# Patient Record
Sex: Female | Born: 1965 | Hispanic: No | Marital: Married | Smoking: Current every day smoker
Health system: Southern US, Community
[De-identification: ages and names within clinical notes are randomized; demographics above are authoritative.]

## PROBLEM LIST (undated history)

## (undated) DIAGNOSIS — I2699 Other pulmonary embolism without acute cor pulmonale: Secondary | ICD-10-CM

## (undated) DIAGNOSIS — J45909 Unspecified asthma, uncomplicated: Secondary | ICD-10-CM

## (undated) HISTORY — PX: TOTAL KNEE ARTHROPLASTY: SHX125

---

## 2012-05-27 ENCOUNTER — Emergency Department: Payer: Self-pay | Admitting: Emergency Medicine

## 2012-05-27 LAB — PREGNANCY, URINE: Pregnancy Test, Urine: NEGATIVE m[IU]/mL

## 2012-10-24 ENCOUNTER — Emergency Department: Payer: Self-pay | Admitting: Emergency Medicine

## 2012-10-24 LAB — COMPREHENSIVE METABOLIC PANEL
Albumin: 3.6 g/dL (ref 3.4–5.0)
Alkaline Phosphatase: 89 U/L (ref 50–136)
Bilirubin,Total: 0.3 mg/dL (ref 0.2–1.0)
Creatinine: 0.38 mg/dL — ABNORMAL LOW (ref 0.60–1.30)
SGOT(AST): 14 U/L — ABNORMAL LOW (ref 15–37)
SGPT (ALT): 24 U/L (ref 12–78)
Sodium: 140 mmol/L (ref 136–145)
Total Protein: 8.1 g/dL (ref 6.4–8.2)

## 2012-10-24 LAB — URINALYSIS, COMPLETE
Bacteria: NONE SEEN
Glucose,UR: NEGATIVE mg/dL (ref 0–75)
Ketone: NEGATIVE
Specific Gravity: 1.014 (ref 1.003–1.030)

## 2012-10-24 LAB — CBC
HCT: 34.1 % — ABNORMAL LOW (ref 35.0–47.0)
HGB: 11.6 g/dL — ABNORMAL LOW (ref 12.0–16.0)
MCH: 32 pg (ref 26.0–34.0)
MCHC: 33.9 g/dL (ref 32.0–36.0)
RDW: 15.1 % — ABNORMAL HIGH (ref 11.5–14.5)

## 2012-10-31 ENCOUNTER — Ambulatory Visit: Payer: Self-pay | Admitting: Ophthalmology

## 2013-05-27 ENCOUNTER — Emergency Department: Payer: Self-pay | Admitting: Emergency Medicine

## 2013-07-05 ENCOUNTER — Emergency Department: Payer: Self-pay | Admitting: Emergency Medicine

## 2013-07-05 ENCOUNTER — Inpatient Hospital Stay (HOSPITAL_COMMUNITY): Payer: Medicaid Other

## 2013-07-05 ENCOUNTER — Observation Stay (HOSPITAL_COMMUNITY)
Admission: AD | Admit: 2013-07-05 | Discharge: 2013-07-05 | Disposition: A | Discharge status: Home / Self Care | Payer: Medicaid Other | Source: Other Acute Inpatient Hospital | Attending: Neurosurgery | Admitting: Neurosurgery

## 2013-07-05 ENCOUNTER — Encounter (HOSPITAL_COMMUNITY): Payer: Self-pay | Admitting: *Deleted

## 2013-07-05 DIAGNOSIS — S065X0A Traumatic subdural hemorrhage without loss of consciousness, initial encounter: Principal | ICD-10-CM | POA: Insufficient documentation

## 2013-07-05 DIAGNOSIS — W1789XA Other fall from one level to another, initial encounter: Secondary | ICD-10-CM | POA: Insufficient documentation

## 2013-07-05 LAB — CBC WITH DIFFERENTIAL/PLATELET
HCT: 30.1 % — ABNORMAL LOW (ref 35.0–47.0)
Lymphocyte %: 36.4 %
MCH: 31.4 pg (ref 26.0–34.0)
Neutrophil #: 3.5 10*3/uL (ref 1.4–6.5)
Neutrophil %: 53 %
RDW: 15.6 % — ABNORMAL HIGH (ref 11.5–14.5)
WBC: 6.5 10*3/uL (ref 3.6–11.0)

## 2013-07-05 LAB — COMPREHENSIVE METABOLIC PANEL
Albumin: 3.2 g/dL — ABNORMAL LOW (ref 3.4–5.0)
Alkaline Phosphatase: 65 U/L (ref 50–136)
Anion Gap: 2 — ABNORMAL LOW (ref 7–16)
Calcium, Total: 8.7 mg/dL (ref 8.5–10.1)
Co2: 30 mmol/L (ref 21–32)
EGFR (Non-African Amer.): >60
Osmolality: 276 (ref 275–301)
SGOT(AST): 13 U/L — ABNORMAL LOW (ref 15–37)
SGPT (ALT): 15 U/L (ref 12–78)
Sodium: 140 mmol/L (ref 136–145)
Total Protein: 7.8 g/dL (ref 6.4–8.2)

## 2013-07-05 LAB — PROTIME-INR: Prothrombin Time: 11.9 secs (ref 11.5–14.7)

## 2013-07-05 MED ORDER — OXYCODONE-ACETAMINOPHEN 5-325 MG PO TABS
1.0000 | ORAL_TABLET | ORAL | Status: DC | PRN
  Administered 2013-07-05 (×2): 1 via ORAL
  Filled 2013-07-05 (×2): qty 1

## 2013-07-05 NOTE — Discharge Summary (Signed)
Physician Discharge Summary  Patient ID: Mariah Robbins MRN: [id number] DOB/AGE: Oct 25, 1966 47 y.o.  Admit date: 07/05/2013 Discharge date: 07/05/2013  Admission Diagnoses: chi, small tentorium sdh  Discharge Diagnoses: same Active Problems:   * No active hospital problems. *   Discharged Condition: decrease headache  Hospital Course: observation  Consults: none  Significant Diagnostic Studies: ct head  Treatments: observation  Discharge Exam: Blood pressure 103/61, pulse 69, temperature 98.3 F (36.8 C), temperature source Oral, resp. rate 18, height 5\' 2"  (1.575 m), weight 88.4 kg (194 lb 14.2 oz), SpO2 98.00%. Normal examination. Ct done shows no changes. Advised not to drink for at least 2 weeks and not to ride motorcycles. To f/u with her MD. To call me prn  Disposition: Final discharge disposition not confirmed     Medication List    Notice   You have not been prescribed any medications.       Signed: Karn Cassis 07/05/2013, 11:56 AM

## 2013-07-05 NOTE — Progress Notes (Signed)
Patient ID: Mariah Robbins, female   DOB: [birth year], 47 y.o.   MRN: [id number] BP 102/68  Pulse 66  Temp(Src) 98.5 F (36.9 C) (Oral)  Resp 18  Ht 5\' 2"  (1.575 m)  Wt 88.4 kg (194 lb 14.2 oz)  BMI 35.64 kg/m2  SpO2 100% Alert and oreinted x 4, speech is clear and fluent Perrl, full eom Symmetric facial movements Hearing intact to voice Moving all extremities well Small blush over the left tentorium, ventricles not effaced, no mass effect. Non operative lesion.  Complaining of headaches currently.

## 2013-07-05 NOTE — Progress Notes (Signed)
Patient education complete and discharge signed.  Pain medication prescription given to patient by Dr. Jeral Fruit.  IV removed and patient left with fiance refusing wheelchair.  Lance Bosch, RN

## 2013-07-05 NOTE — Progress Notes (Signed)
Patient arrived to floor.  Dr. Jeral Fruit called for orders. Orders received for pain medication and diet.
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
## 2013-10-13 ENCOUNTER — Emergency Department: Payer: Self-pay | Admitting: Emergency Medicine

## 2013-10-13 LAB — URINALYSIS, COMPLETE
Bacteria: NONE SEEN
Ketone: NEGATIVE
Nitrite: NEGATIVE
Ph: 5 (ref 4.5–8.0)
Protein: NEGATIVE
Specific Gravity: 1.013 (ref 1.003–1.030)
Squamous Epithelial: 1
WBC UR: <1 /HPF (ref 0–5)

## 2014-01-03 ENCOUNTER — Emergency Department: Payer: Self-pay | Admitting: Emergency Medicine

## 2015-10-22 ENCOUNTER — Encounter: Payer: Self-pay | Admitting: Emergency Medicine

## 2015-10-22 ENCOUNTER — Emergency Department
Admission: EM | Admit: 2015-10-22 | Discharge: 2015-10-22 | Disposition: A | Discharge status: Home / Self Care | Payer: Medicaid Other | Attending: Emergency Medicine | Admitting: Emergency Medicine

## 2015-10-22 ENCOUNTER — Emergency Department: Payer: Medicaid Other

## 2015-10-22 DIAGNOSIS — W108XXA Fall (on) (from) other stairs and steps, initial encounter: Secondary | ICD-10-CM | POA: Diagnosis not present

## 2015-10-22 DIAGNOSIS — Y998 Other external cause status: Secondary | ICD-10-CM | POA: Insufficient documentation

## 2015-10-22 DIAGNOSIS — F17210 Nicotine dependence, cigarettes, uncomplicated: Secondary | ICD-10-CM | POA: Insufficient documentation

## 2015-10-22 DIAGNOSIS — Z96651 Presence of right artificial knee joint: Secondary | ICD-10-CM | POA: Insufficient documentation

## 2015-10-22 DIAGNOSIS — Y9301 Activity, walking, marching and hiking: Secondary | ICD-10-CM | POA: Diagnosis not present

## 2015-10-22 DIAGNOSIS — S99911A Unspecified injury of right ankle, initial encounter: Secondary | ICD-10-CM | POA: Diagnosis present

## 2015-10-22 DIAGNOSIS — S8391XA Sprain of unspecified site of right knee, initial encounter: Secondary | ICD-10-CM | POA: Insufficient documentation

## 2015-10-22 DIAGNOSIS — Y9289 Other specified places as the place of occurrence of the external cause: Secondary | ICD-10-CM | POA: Insufficient documentation

## 2015-10-22 DIAGNOSIS — S93401A Sprain of unspecified ligament of right ankle, initial encounter: Secondary | ICD-10-CM | POA: Diagnosis not present

## 2015-10-22 MED ORDER — MELOXICAM 15 MG PO TABS: 15.0000 mg | ORAL_TABLET | Freq: Every day | ORAL | Status: DC

## 2015-10-22 MED ORDER — HYDROCODONE-ACETAMINOPHEN 5-325 MG PO TABS
1.0000 | ORAL_TABLET | Freq: Once | ORAL | Status: AC
  Administered 2015-10-22: 1 via ORAL
  Filled 2015-10-22: qty 1

## 2015-10-22 NOTE — ED Notes (Signed)
Fell twisted right ankle   And also having some pain to knee

## 2015-10-22 NOTE — ED Provider Notes (Signed)
[facility] Emergency Department Provider Note  ____________________________________________  Time seen: Approximately 11:30 AM  I have reviewed the triage vital signs and the nursing notes.   HISTORY  Chief Complaint Ankle Pain    HPI Mariah SchirmerJessica Robbins is a 49 y.o. female who presents to emergency department complaining of right ankle and right knee pain. She states that she "missed a step" yesterday while walking down some stairs. She "twisted my ankle, and hit my knee. She is now endorsing sharp right ankle pain as well as sharp pain to the right medial knee. Patient does have a history of knee replacement. Patient states that ankle pain is located over the lateral and medial malleolus as well as the posterior calcaneus. Patient is also endorsing sharp medial knee pain. She denies any other injury. She denies hitting her head and losing consciousness. She states the pain is constant, severe, and worse with ambulation.   History reviewed. No pertinent past medical history.  There are no active problems to display for this patient.   Past Surgical History  Procedure Laterality Date  . Total knee arthroplasty      Current Outpatient Rx  Name  Route  Sig  Dispense  Refill  . meloxicam (MOBIC) 15 MG tablet   Oral   Take 1 tablet (15 mg total) by mouth daily.   30 tablet   0     Allergies Review of patient's allergies indicates no known allergies.  No family history on file.  Social History Social History  Substance Use Topics  . Smoking status: Current Every Day Smoker -- 1.00 packs/day for 20 years    Types: Cigarettes  . Smokeless tobacco: None  . Alcohol Use: 1.0 oz/week    2 Standard drinks or equivalent per week    Review of Systems Constitutional: No fever/chills Eyes: No visual changes. ENT: No sore throat. Cardiovascular: Denies chest pain. Respiratory: Denies shortness of breath. Gastrointestinal: No abdominal pain.  No nausea,  no vomiting.  No diarrhea.  No constipation. Genitourinary: Negative for dysuria. Musculoskeletal: Negative for back pain. Endorses right ankle and right knee pain. Skin: Negative for rash. Neurological: Negative for headaches, focal weakness or numbness.  10-point ROS otherwise negative.  ____________________________________________   PHYSICAL EXAM:  VITAL SIGNS: ED Triage Vitals  Enc Vitals Group     BP 10/22/15 0945 124/68 mmHg     Pulse Rate 10/22/15 0942 89     Resp 10/22/15 0942 20     Temp 10/22/15 0942 98.4 F (36.9 C)     Temp Source 10/22/15 0942 Oral     SpO2 10/22/15 0942 98 %     Weight 10/22/15 0942 198 lb (89.812 kg)     Height 10/22/15 0942 5\' 2"  (1.575 m)     Head Cir --      Peak Flow --      Pain Score --      Pain Loc --      Pain Edu? --      Excl. in GC? --     Constitutional: Alert and oriented. Well appearing and in no acute distress. Eyes: Conjunctivae are normal. PERRL. EOMI. Head: Atraumatic. Nose: No congestion/rhinnorhea. Mouth/Throat: Mucous membranes are moist.  Oropharynx non-erythematous. Neck: No stridor.   Cardiovascular: Normal rate, regular rhythm. Grossly normal heart sounds.  Good peripheral circulation. Respiratory: Normal respiratory effort.  No retractions. Lungs CTAB. Gastrointestinal: Soft and nontender. No distention. No abdominal bruits. No CVA tenderness. Musculoskeletal: No visible deformity or  edema noted to right knee when compared with left. Surgical scar is appreciated to the anterior aspect knee. Patient is tender to palpation over the medial joint line. No other tenderness to palpation of the knee. Patient has edema to right lateral malleolus upon inspection. No other visible injury. Patient is diffusely tender to palpation over the medial and lateral malleolus as well as the posterior calcaneus. No tenderness to palpation over the tarsal or metatarsal bones. No tenderness to palpation over the base of the fifth  metatarsal. Patient has limited range of motion of ankle due to pain. Neurologic:  Normal speech and language. No gross focal neurologic deficits are appreciated. No gait instability. Skin:  Skin is warm, dry and intact. No rash noted. Psychiatric: Mood and affect are normal. Speech and behavior are normal.  ____________________________________________   LABS (all labs ordered are listed, but only abnormal results are displayed)  Labs Reviewed - No data to display ____________________________________________  EKG   ____________________________________________  RADIOLOGY  Right knee x-ray Impression: Prosthetic right knee joint and surrounding native bone or unremarkable.  Right ankle x-ray Impression: No acute fracture dislocation of the right ankle. There is a large amount of soft tissue swelling anteriorly and laterally. ____________________________________________   PROCEDURES  Procedure(s) performed: None  Critical Care performed: No  ____________________________________________   INITIAL IMPRESSION / ASSESSMENT AND PLAN / ED COURSE  Pertinent labs & imaging results that were available during my care of the patient were reviewed by me and considered in my medical decision making (see chart for details).  Agents history, symptoms, physical exam are taken into consideration her diagnosis. Diagnosis is consistent with right ankle sprain and right knee sprain. Advised patient of findings and diagnosis and she verbalizes understanding same. Patient will be given anti-inflammatories for symptomatic control. Patient's ankle is wrapped in an Ace bandage. Patient given crutches to use. Patient discharged with instructions to follow up with orthopedics should symptoms persist. Patient verbalizes understanding of diagnosis and treatment plan and verbalizes compliance with same. ____________________________________________   FINAL CLINICAL IMPRESSION(S) / ED DIAGNOSES  Final  diagnoses:  Right ankle sprain, initial encounter  Right knee sprain, initial encounter      Racheal Patches, PA-C 10/22/15 1212  Emily Filbert, MD 10/22/15 1323

## 2015-10-22 NOTE — Discharge Instructions (Signed)
Ankle Sprain °An ankle sprain is an injury to the strong, fibrous tissues (ligaments) that hold the bones of your ankle joint together.  °CAUSES °An ankle sprain is usually caused by a fall or by twisting your ankle. Ankle sprains most commonly occur when you step on the outer edge of your foot, and your ankle turns inward. People who participate in sports are more prone to these types of injuries.  °SYMPTOMS  °· Pain in your ankle. The pain may be present at rest or only when you are trying to stand or walk. °· Swelling. °· Bruising. Bruising may develop immediately or within 1 to 2 days after your injury. °· Difficulty standing or walking, particularly when turning corners or changing directions. °DIAGNOSIS  °Your caregiver will ask you details about your injury and perform a physical exam of your ankle to determine if you have an ankle sprain. During the physical exam, your caregiver will press on and apply pressure to specific areas of your foot and ankle. Your caregiver will try to move your ankle in certain ways. An X-ray exam may be done to be sure a bone was not broken or a ligament did not separate from one of the bones in your ankle (avulsion fracture).  °TREATMENT  °Certain types of braces can help stabilize your ankle. Your caregiver can make a recommendation for this. Your caregiver may recommend the use of medicine for pain. If your sprain is severe, your caregiver may refer you to a surgeon who helps to restore function to parts of your skeletal system (orthopedist) or a physical therapist. °HOME CARE INSTRUCTIONS  °· Apply ice to your injury for 1-2 days or as directed by your caregiver. Applying ice helps to reduce inflammation and pain. °· Put ice in a plastic bag. °· Place a towel between your skin and the bag. °· Leave the ice on for 15-20 minutes at a time, every 2 hours while you are awake. °· Only take over-the-counter or prescription medicines for pain, discomfort, or fever as directed by  your caregiver. °· Elevate your injured ankle above the level of your heart as much as possible for 2-3 days. °· If your caregiver recommends crutches, use them as instructed. Gradually put weight on the affected ankle. Continue to use crutches or a cane until you can walk without feeling pain in your ankle. °· If you have a plaster splint, wear the splint as directed by your caregiver. Do not rest it on anything harder than a pillow for the first 24 hours. Do not put weight on it. Do not get it wet. You may take it off to take a shower or bath. °· You may have been given an elastic bandage to wear around your ankle to provide support. If the elastic bandage is too tight (you have numbness or tingling in your foot or your foot becomes cold and blue), adjust the bandage to make it comfortable. °· If you have an air splint, you may blow more air into it or let air out to make it more comfortable. You may take your splint off at night and before taking a shower or bath. Wiggle your toes in the splint several times per day to decrease swelling. °SEEK MEDICAL CARE IF:  °· You have rapidly increasing bruising or swelling. °· Your toes feel extremely cold or you lose feeling in your foot. °· Your pain is not relieved with medicine. °SEEK IMMEDIATE MEDICAL CARE IF: °· Your toes are numb or blue. °·   You have severe pain that is increasing. °MAKE SURE YOU:  °· Understand these instructions. °· Will watch your condition. °· Will get help right away if you are not doing well or get worse. °  °This information is not intended to replace advice given to you by your health care provider. Make sure you discuss any questions you have with your health care provider. °  °Document Released: 11/06/2005 Document Revised: 11/27/2014 Document Reviewed: 11/18/2011 °Elsevier Interactive Patient Education ©2016 [employer] ° °Cryotherapy °Cryotherapy means treatment with cold. Ice or gel packs can be used to reduce both pain and swelling.  Ice is the most helpful within the first 24 to 48 hours after an injury or flare-up from overusing a muscle or joint. Sprains, strains, spasms, burning pain, shooting pain, and aches can all be eased with ice. Ice can also be used when recovering from surgery. Ice is effective, has very few side effects, and is safe for most people to use. °PRECAUTIONS  °Ice is not a safe treatment option for people with: °· Raynaud phenomenon. This is a condition affecting small blood vessels in the extremities. Exposure to cold may cause your problems to return. °· Cold hypersensitivity. There are many forms of cold hypersensitivity, including: °¨ Cold urticaria. Red, itchy hives appear on the skin when the tissues begin to warm after being iced. °¨ Cold erythema. This is a red, itchy rash caused by exposure to cold. °¨ Cold hemoglobinuria. Red blood cells break down when the tissues begin to warm after being iced. The hemoglobin that carry oxygen are passed into the urine because they cannot combine with blood proteins fast enough. °· Numbness or altered sensitivity in the area being iced. °If you have any of the following conditions, do not use ice until you have discussed cryotherapy with your caregiver: °· Heart conditions, such as arrhythmia, angina, or chronic heart disease. °· High blood pressure. °· Healing wounds or open skin in the area being iced. °· Current infections. °· Rheumatoid arthritis. °· Poor circulation. °· Diabetes. °Ice slows the blood flow in the region it is applied. This is beneficial when trying to stop inflamed tissues from spreading irritating chemicals to surrounding tissues. However, if you expose your skin to cold temperatures for too long or without the proper protection, you can damage your skin or nerves. Watch for signs of skin damage due to cold. °HOME CARE INSTRUCTIONS °Follow these tips to use ice and cold packs safely. °· Place a dry or damp towel between the ice and skin. A damp towel will  cool the skin more quickly, so you may need to shorten the time that the ice is used. °· For a more rapid response, add gentle compression to the ice. °· Ice for no more than 10 to 20 minutes at a time. The bonier the area you are icing, the less time it will take to get the benefits of ice. °· Check your skin after 5 minutes to make sure there are no signs of a poor response to cold or skin damage. °· Rest 20 minutes or more between uses. °· Once your skin is numb, you can end your treatment. You can test numbness by very lightly touching your skin. The touch should be so light that you do not see the skin dimple from the pressure of your fingertip. When using ice, most people will feel these normal sensations in this order: cold, burning, aching, and numbness. °· Do not use ice on someone who   cannot communicate their responses to pain, such as small children or people with dementia. °HOW TO MAKE AN ICE PACK °Ice packs are the most common way to use ice therapy. Other methods include ice massage, ice baths, and cryosprays. Muscle creams that cause a cold, tingly feeling do not offer the same benefits that ice offers and should not be used as a substitute unless recommended by your caregiver. °To make an ice pack, do one of the following: °· Place crushed ice or a bag of frozen vegetables in a sealable plastic bag. Squeeze out the excess air. Place this bag inside another plastic bag. Slide the bag into a pillowcase or place a damp towel between your skin and the bag. °· Mix 3 parts water with 1 part rubbing alcohol. Freeze the mixture in a sealable plastic bag. When you remove the mixture from the freezer, it will be slushy. Squeeze out the excess air. Place this bag inside another plastic bag. Slide the bag into a pillowcase or place a damp towel between your skin and the bag. °SEEK MEDICAL CARE IF: °· You develop white spots on your skin. This may give the skin a blotchy (mottled) appearance. °· Your skin turns  blue or pale. °· Your skin becomes waxy or hard. °· Your swelling gets worse. °MAKE SURE YOU:  °· Understand these instructions. °· Will watch your condition. °· Will get help right away if you are not doing well or get worse. °  °This information is not intended to replace advice given to you by your health care provider. Make sure you discuss any questions you have with your health care provider. °  °Document Released: 07/03/2011 Document Revised: 11/27/2014 Document Reviewed: 07/03/2011 °Elsevier Interactive Patient Education ©2016 [employer] ° °Elastic Bandage and RICE °WHAT DOES AN ELASTIC BANDAGE DO? °Elastic bandages come in different shapes and sizes. They generally provide support to your injury and reduce swelling while you are healing, but they can perform different functions. Your health care provider will help you to decide what is best for your protection, recovery, or rehabilitation following an injury. °WHAT ARE SOME GENERAL TIPS FOR USING AN ELASTIC BANDAGE? °· Use the bandage as directed by the maker of the bandage that you are using. °· Do not wrap the bandage too tightly. This may cut off the circulation in the arm or leg in the area below the bandage. °¨ If part of your body beyond the bandage becomes blue, numb, cold, swollen, or is more painful, your bandage is most likely too tight. If this occurs, remove your bandage and reapply it more loosely. °· See your health care provider if the bandage seems to be making your problems worse rather than better. °· An elastic bandage should be removed and reapplied every 3-4 hours or as directed by your health care provider. °WHAT IS RICE? °The routine care of many injuries includes rest, ice, compression, and elevation (RICE therapy).  °Rest °Rest is required to allow your body to heal. Generally, you can resume your routine activities when you are comfortable and have been given permission by your health care provider. °Ice °Icing your injury helps  to keep the swelling down and it reduces pain. Do not apply ice directly to your skin. °· Put ice in a plastic bag. °· Place a towel between your skin and the bag. °· Leave the ice on for 20 minutes, 2-3 times per day. °Do this for as long as you are directed by your health   care provider. °Compression °Compression helps to keep swelling down, gives support, and helps with discomfort. Compression may be done with an elastic bandage. °Elevation °Elevation helps to reduce swelling and it decreases pain. If possible, your injured area should be placed at or above the level of your heart or the center of your chest. °WHEN SHOULD I SEEK MEDICAL CARE? °You should seek medical care if: °· You have persistent pain and swelling. °· Your symptoms are getting worse rather than improving. °These symptoms may indicate that further evaluation or further X-rays are needed. Sometimes, X-rays may not show a small broken bone (fracture) until a number of days later. Make a follow-up appointment with your health care provider. Ask when your X-ray results will be ready. Make sure that you get your X-ray results. °WHEN SHOULD I SEEK IMMEDIATE MEDICAL CARE? °You should seek immediate medical care if: °· You have a sudden onset of severe pain at or below the area of your injury. °· You develop redness or increased swelling around your injury. °· You have tingling or numbness at or below the area of your injury that does not improve after you remove the elastic bandage. °  °This information is not intended to replace advice given to you by your health care provider. Make sure you discuss any questions you have with your health care provider. °  °Document Released: 04/28/2002 Document Revised: 07/28/2015 Document Reviewed: 06/22/2014 °Elsevier Interactive Patient Education ©2016 [employer] ° °

## 2016-08-01 ENCOUNTER — Emergency Department
Admission: EM | Admit: 2016-08-01 | Discharge: 2016-08-01 | Disposition: A | Discharge status: Home / Self Care | Payer: Medicaid Other | Attending: Emergency Medicine | Admitting: Emergency Medicine

## 2016-08-01 ENCOUNTER — Encounter: Payer: Self-pay | Admitting: Emergency Medicine

## 2016-08-01 DIAGNOSIS — F17210 Nicotine dependence, cigarettes, uncomplicated: Secondary | ICD-10-CM | POA: Diagnosis not present

## 2016-08-01 DIAGNOSIS — M25571 Pain in right ankle and joints of right foot: Secondary | ICD-10-CM | POA: Diagnosis not present

## 2016-08-01 DIAGNOSIS — L209 Atopic dermatitis, unspecified: Secondary | ICD-10-CM | POA: Diagnosis not present

## 2016-08-01 DIAGNOSIS — L0231 Cutaneous abscess of buttock: Secondary | ICD-10-CM | POA: Diagnosis present

## 2016-08-01 MED ORDER — SULFAMETHOXAZOLE-TRIMETHOPRIM 800-160 MG PO TABS: 1.0000 | ORAL_TABLET | Freq: Two times a day (BID) | ORAL | 0 refills | Status: DC

## 2016-08-01 MED ORDER — HYDROCODONE-ACETAMINOPHEN 5-325 MG PO TABS: 1.0000 | ORAL_TABLET | ORAL | 0 refills | Status: DC | PRN

## 2016-08-01 MED ORDER — TRIAMCINOLONE ACETONIDE 0.5 % EX OINT: 1.0000 "application " | TOPICAL_OINTMENT | Freq: Two times a day (BID) | CUTANEOUS | 0 refills | Status: DC

## 2016-08-01 MED ORDER — HYDROCODONE-ACETAMINOPHEN 5-325 MG PO TABS
1.0000 | ORAL_TABLET | Freq: Once | ORAL | Status: AC
Start: 2016-08-01 — End: 2016-08-01
  Administered 2016-08-01: 1 via ORAL
  Filled 2016-08-01: qty 1

## 2016-08-01 MED ORDER — LIDOCAINE-EPINEPHRINE (PF) 1 %-1:200000 IJ SOLN
30.0000 mL | Freq: Once | INTRAMUSCULAR | Status: AC
  Administered 2016-08-01: 30 mL
  Filled 2016-08-01: qty 30

## 2016-08-01 MED ORDER — MELOXICAM 15 MG PO TABS: 15.0000 mg | ORAL_TABLET | Freq: Every day | ORAL | 0 refills | Status: DC

## 2016-08-01 NOTE — ED Triage Notes (Signed)
Abscess to sacral area for three days with foul odor and drainage.

## 2016-08-01 NOTE — ED Provider Notes (Signed)
[facility] Emergency Department Provider Note  ____________________________________________  Time seen: Approximately 1:15 PM  I have reviewed the triage vital signs and the nursing notes.   HISTORY  Chief Complaint Abscess   HPI Mariah Robbins is a 50 y.o. female who presents to the emergency department for evaluation of an abscess to the sacral area. Area has been present for the past 3 days. She states that she has been having her husband "squeeze it" and has been getting out from drainage with a foul odor. She denies fever or nausea/vomiting. She also complains today of skin irritation and peeling to her left hand. This is been present for several weeks and she has attempted to use hydrocortisone cream as well as some leftover prescription creams without any relief. She also complains of left ankle pain. She states that this is been present for quite some time. She states that the pain seems to be worse in the morning and has been getting worse for the past 2 days.  History reviewed. No pertinent past medical history.  There are no active problems to display for this patient.   Past Surgical History:  Procedure Laterality Date  . TOTAL KNEE ARTHROPLASTY      Prior to Admission medications   Medication Sig Start Date End Date Taking? Authorizing Provider  HYDROcodone-acetaminophen (NORCO/VICODIN) 5-325 MG tablet Take 1 tablet by mouth every 4 (four) hours as needed. 08/01/16   Chinita Pester, FNP  meloxicam (MOBIC) 15 MG tablet Take 1 tablet (15 mg total) by mouth daily. 08/01/16   Chinita Pester, FNP  sulfamethoxazole-trimethoprim (BACTRIM DS,SEPTRA DS) 800-160 MG tablet Take 1 tablet by mouth 2 (two) times daily. 08/01/16   Chinita Pester, FNP  triamcinolone ointment (KENALOG) 0.5 % Apply 1 application topically 2 (two) times daily. 08/01/16   Chinita Pester, FNP    Allergies Review of patient's allergies indicates no known allergies.  No family  history on file.  Social History Social History  Substance Use Topics  . Smoking status: Current Every Day Smoker    Packs/day: 1.00    Years: 20.00    Types: Cigarettes  . Smokeless tobacco: Never Used  . Alcohol use 1.0 oz/week    2 Standard drinks or equivalent per week    Review of Systems  Constitutional: Negative for fever/chills Respiratory: Negative for shortness of breath. Musculoskeletal: Positive for pain. Skin: Positive for lesion on the right buttock Neurological: Negative for headaches, focal weakness or numbness. ____________________________________________   PHYSICAL EXAM:  VITAL SIGNS: ED Triage Vitals  Enc Vitals Group     BP 08/01/16 1136 (!) 125/97     Pulse Rate 08/01/16 1136 (!) 103     Resp 08/01/16 1136 20     Temp 08/01/16 1136 98.9 F (37.2 C)     Temp Source 08/01/16 1136 Oral     SpO2 08/01/16 1136 98 %     Weight 08/01/16 1136 210 lb (95.3 kg)     Height 08/01/16 1136 5\' 2"  (1.575 m)     Head Circumference --      Peak Flow --      Pain Score 08/01/16 1137 10     Pain Loc --      Pain Edu? --      Excl. in GC? --      Constitutional: Alert and oriented. Well appearing and in no acute distress. Eyes: Conjunctivae are normal. EOMI. Nose: No congestion/rhinnorhea. Mouth/Throat: Mucous membranes are moist.  Neck: No stridor. Cardiovascular: Good peripheral circulation. Respiratory: Normal respiratory effort.  No retractions. Musculoskeletal: FROM throughout. Pain in the left ankle is not reproducible with movement or palpation. Neurologic:  Normal speech and language. No gross focal neurologic deficits are appreciated. Skin:  Indurated and fluctuant lesion in the right buttock with scant amount of purulent drainage noted.  ____________________________________________   LABS (all labs ordered are listed, but only abnormal results are displayed)  Labs Reviewed - No data to  display ____________________________________________  EKG   ____________________________________________  RADIOLOGY  Not indicated ____________________________________________   PROCEDURES  Procedure(s) performed: INCISION AND DRAINAGE Performed by: Kem Boroughsari Harvest Stanco Consent: Verbal consent obtained. Risks and benefits: risks, benefits and alternatives were discussed Type: abscess  Body area: right buttock  Anesthesia: local infiltration  Incision was made with a scalpel.  Local anesthetic: lidocaine 1% with epinephrine  Anesthetic total: 4 ml  Complexity: complex Blunt dissection to break up loculations  Drainage: purulent  Drainage amount: Large  Packing material: 1/4 in iodoform gauze  Patient tolerance: Patient tolerated the procedure well with no immediate complications.    ____________________________________________   INITIAL IMPRESSION / ASSESSMENT AND PLAN / ED COURSE  Clinical Course    Pertinent labs & imaging results that were available during my care of the patient were reviewed by me and considered in my medical decision making (see chart for details).  Patient will be advised to take the antibiotic until finished. She is to use a warm compress 4 times per day. She was advised to pull the packing out in 2 days if the drainage has stopped and the pain is lessening.   [facility]  She was also advised to return to the emergency department for symptoms that change or worsen if unable to schedule an appointment.  ____________________________________________   FINAL CLINICAL IMPRESSION(S) / ED DIAGNOSES  Final diagnoses:  Abscess of buttock, right  Atopic dermatitis  Ankle pain, right    Discharge Medication List as of 08/01/2016  2:20 PM    START taking these medications   Details  HYDROcodone-acetaminophen (NORCO/VICODIN) 5-325 MG tablet Take 1 tablet by mouth every 4 (four) hours as needed.,  Starting Tue 08/01/2016, Print    sulfamethoxazole-trimethoprim (BACTRIM DS,SEPTRA DS) 800-160 MG tablet Take 1 tablet by mouth 2 (two) times daily., Starting Tue 08/01/2016, Print    triamcinolone ointment (KENALOG) 0.5 % Apply 1 application topically 2 (two) times daily., Starting Tue 08/01/2016, Print        Note:  This document was prepared using Dragon voice recognition software and may include unintentional dictation errors.    Chinita PesterCari B Shrihaan Porzio, FNP 08/01/16 1505    Myrna Blazeravid Matthew Schaevitz, MD 08/01/16 1550

## 2016-08-01 NOTE — Discharge Instructions (Signed)
Pull the packing out in 2 days if the drainage has stopped. If the pain continues and the area continues to drain, leave the packing in place and return to the ER. Follow up with your PCP for other complaints.

## 2016-08-01 NOTE — ED Notes (Signed)
States she developed an abscess area to buttocks about 3 days ago. Now area is draining.  foul smelling odor. Also wants someone to look at finger on left hand and foot.

## 2016-11-27 ENCOUNTER — Emergency Department: Payer: Medicaid Other

## 2016-11-27 ENCOUNTER — Encounter: Payer: Self-pay | Admitting: Emergency Medicine

## 2016-11-27 ENCOUNTER — Emergency Department
Admission: EM | Admit: 2016-11-27 | Discharge: 2016-11-27 | Disposition: A | Discharge status: Home / Self Care | Payer: Medicaid Other | Attending: Emergency Medicine | Admitting: Emergency Medicine

## 2016-11-27 DIAGNOSIS — Y939 Activity, unspecified: Secondary | ICD-10-CM | POA: Diagnosis not present

## 2016-11-27 DIAGNOSIS — Y999 Unspecified external cause status: Secondary | ICD-10-CM | POA: Diagnosis not present

## 2016-11-27 DIAGNOSIS — F17210 Nicotine dependence, cigarettes, uncomplicated: Secondary | ICD-10-CM | POA: Diagnosis not present

## 2016-11-27 DIAGNOSIS — W19XXXA Unspecified fall, initial encounter: Secondary | ICD-10-CM | POA: Diagnosis not present

## 2016-11-27 DIAGNOSIS — S63501A Unspecified sprain of right wrist, initial encounter: Secondary | ICD-10-CM | POA: Insufficient documentation

## 2016-11-27 DIAGNOSIS — Y929 Unspecified place or not applicable: Secondary | ICD-10-CM | POA: Diagnosis not present

## 2016-11-27 DIAGNOSIS — S6991XA Unspecified injury of right wrist, hand and finger(s), initial encounter: Secondary | ICD-10-CM | POA: Diagnosis present

## 2016-11-27 DIAGNOSIS — J45909 Unspecified asthma, uncomplicated: Secondary | ICD-10-CM | POA: Insufficient documentation

## 2016-11-27 HISTORY — DX: Other pulmonary embolism without acute cor pulmonale: I26.99

## 2016-11-27 HISTORY — DX: Unspecified asthma, uncomplicated: J45.909

## 2016-11-27 MED ORDER — IBUPROFEN 600 MG PO TABS
600.0000 mg | ORAL_TABLET | Freq: Once | ORAL | Status: AC
  Administered 2016-11-27: 600 mg via ORAL
  Filled 2016-11-27: qty 1

## 2016-11-27 NOTE — ED Triage Notes (Signed)
Pt reports falling on right arm x2 days ago, reports she heard a "pop" today. Pt up at front desk moving arm with full ROM.

## 2016-11-27 NOTE — ED Provider Notes (Signed)
[facility] Emergency Department Provider Note  ____________________________________________   First MD Initiated Contact with Patient 11/27/16 [phone]     (approximate)  I have reviewed the triage vital signs and the nursing notes.   HISTORY  Chief Complaint Arm Pain   HPI Mariah Robbins is a 51 y.o. female is here with complaint of right wrist pain. Patient states thatshe felt her wrist "pop" in today. She also reports falling 2 days ago. Patient states that movement of her right wrist increases her pain. Patient states that she took over-the-counter ibuprofen 2 tablets last evening without any wheezes relief of her pain. Has not taken anything this morning prior to arrival in the emergency room. She denies any previous fractures to her wrist. She denies any other injuries. She rates her pain as 8 out of 10 at this time.   Past Medical History:  Diagnosis Date  . Asthma   . Pulmonary emboli (HCC)     There are no active problems to display for this patient.   Past Surgical History:  Procedure Laterality Date  . TOTAL KNEE ARTHROPLASTY      Prior to Admission medications   Medication Sig Start Date End Date Taking? Authorizing Provider  rivaroxaban (XARELTO) 20 MG TABS tablet Take 20 mg by mouth daily with supper.   Yes Historical Provider, MD    Allergies Ivp dye [iodinated diagnostic agents]  No family history on file.  Social History Social History  Substance Use Topics  . Smoking status: Current Every Day Smoker    Packs/day: 1.00    Years: 20.00    Types: Cigarettes  . Smokeless tobacco: Never Used  . Alcohol use 1.0 oz/week    2 Standard drinks or equivalent per week    Review of Systems Constitutional: No fever/chills Cardiovascular: Denies chest pain. Respiratory: Denies shortness of breath. Gastrointestinal:   No nausea, no vomiting. Musculoskeletal: Positive right wrist pain. Skin: Negative for rash. Neurological:  Negative for headaches, focal weakness or numbness.  10-point ROS otherwise negative.  ____________________________________________   PHYSICAL EXAM:  VITAL SIGNS: ED Triage Vitals [11/27/16 0836]  Enc Vitals Group     BP 117/90     Pulse Rate 100     Resp 18     Temp 99 F (37.2 C)     Temp Source Oral     SpO2 99 %     Weight 206 lb (93.4 kg)     Height 5\' 2"  (1.575 m)     Head Circumference      Peak Flow      Pain Score 8     Pain Loc      Pain Edu?      Excl. in GC?     Constitutional: Alert and oriented. Well appearing and in no acute distress. Eyes: Conjunctivae are normal. PERRL. EOMI. Head: Atraumatic. Nose: No congestion/rhinnorhea. Neck: No stridor.   Cardiovascular: Normal rate, regular rhythm. Grossly normal heart sounds.  Good peripheral circulation. Respiratory: Normal respiratory effort.  No retractions. Lungs CTAB. Gastrointestinal: Soft and nontender. No distention.  Musculoskeletal: Examination of the right wrist there is no gross deformity noted and no soft tissue swelling in comparison to her left wrist. Patient is reluctant to allow range of motion. Patient is able to move digits distally and make a fist. Motor sensory function intact. Skin is intact. There is no ecchymosis or abrasions seen. Neurologic:  Normal speech and language. No gross focal neurologic deficits are appreciated.  No gait instability. Skin:  Skin is warm, dry and intact. No rash noted. Psychiatric: Mood and affect are normal. Speech and behavior are normal.  ____________________________________________   LABS (all labs ordered are listed, but only abnormal results are displayed)  Labs Reviewed - No data to display  RADIOLOGY Right wrist x-ray per radiologist negative for fracture. I, Tommi Rumpshonda L Summers, personally viewed and evaluated these images (plain radiographs) as part of my medical decision making, as well as reviewing the written report by the  radiologist.  ____________________________________________   PROCEDURES  Procedure(s) performed: None  Procedures  Critical Care performed: No  ____________________________________________   INITIAL IMPRESSION / ASSESSMENT AND PLAN / ED COURSE  Pertinent labs & imaging results that were available during my care of the patient were reviewed by me and considered in my medical decision making (see chart for details).    Clinical Course    Patient was placed in a cockup wrist splint and told that she does not have a fracture but to follow-up with her primary care doctor or Dr. Martha ClanKrasinski if any continued problems with her wrist. Patient states she does not have any pain medication at home has only been taking over-the-counter pain medication without any relief. She is made aware that her name does show up in the database is receiving hydrocodone 120 tablets 5 days ago from Dr. Octavio MannsIves.  Patient looked bewildered when told this information. Then she immediately said that she had been taking hydrocodone and it does not help her pain she needs something stronger. Patient was told to call her pain doctor and arrange for stronger medication if needed. Patient then went next door to her husband's room and inquired on what pain medication he was getting. Both were discharged.  ____________________________________________   FINAL CLINICAL IMPRESSION(S) / ED DIAGNOSES  Final diagnoses:  Sprain of right wrist, initial encounter      NEW MEDICATIONS STARTED DURING THIS VISIT:  Discharge Medication List as of 11/27/2016 11:34 AM       Note:  This document was prepared using Dragon voice recognition software and may include unintentional dictation errors.    Tommi RumpsRhonda L Summers, PA-C 11/27/16 1615    Sharman CheekPhillip Stafford, MD 12/02/16 [phone]

## 2016-11-27 NOTE — Discharge Instructions (Signed)
Continue your pain medications that  your doctor has prescribed and take as directed. You may use ice to the wrist as needed. Wear wrist splint for support. If you continue to have wrist pain and should follow-up with Dr. Martha ClanKrasinski.

## 2016-11-27 NOTE — ED Notes (Signed)
Pt stating "I need a shot, I can get pills whenever I want them." When informed that pt should continue taking pain medicine prescribed. Pt appears angry. Has been walking around hallway.

## 2017-08-31 ENCOUNTER — Encounter: Payer: Self-pay | Admitting: Emergency Medicine

## 2017-08-31 ENCOUNTER — Emergency Department: Payer: Medicaid Other

## 2017-08-31 ENCOUNTER — Emergency Department
Admission: EM | Admit: 2017-08-31 | Discharge: 2017-08-31 | Disposition: A | Discharge status: Other Acute Inpatient Hospital | Payer: Medicaid Other | Attending: Emergency Medicine | Admitting: Emergency Medicine

## 2017-08-31 DIAGNOSIS — Z96659 Presence of unspecified artificial knee joint: Secondary | ICD-10-CM | POA: Diagnosis not present

## 2017-08-31 DIAGNOSIS — W19XXXA Unspecified fall, initial encounter: Secondary | ICD-10-CM | POA: Diagnosis not present

## 2017-08-31 DIAGNOSIS — Z7901 Long term (current) use of anticoagulants: Secondary | ICD-10-CM | POA: Diagnosis not present

## 2017-08-31 DIAGNOSIS — F17210 Nicotine dependence, cigarettes, uncomplicated: Secondary | ICD-10-CM | POA: Insufficient documentation

## 2017-08-31 DIAGNOSIS — S065X9A Traumatic subdural hemorrhage with loss of consciousness of unspecified duration, initial encounter: Secondary | ICD-10-CM | POA: Insufficient documentation

## 2017-08-31 DIAGNOSIS — Y999 Unspecified external cause status: Secondary | ICD-10-CM | POA: Insufficient documentation

## 2017-08-31 DIAGNOSIS — Y929 Unspecified place or not applicable: Secondary | ICD-10-CM | POA: Insufficient documentation

## 2017-08-31 DIAGNOSIS — S0990XA Unspecified injury of head, initial encounter: Secondary | ICD-10-CM | POA: Diagnosis present

## 2017-08-31 DIAGNOSIS — S065XAA Traumatic subdural hemorrhage with loss of consciousness status unknown, initial encounter: Secondary | ICD-10-CM

## 2017-08-31 DIAGNOSIS — J45909 Unspecified asthma, uncomplicated: Secondary | ICD-10-CM | POA: Diagnosis not present

## 2017-08-31 DIAGNOSIS — Y9389 Activity, other specified: Secondary | ICD-10-CM | POA: Diagnosis not present

## 2017-08-31 LAB — PROTIME-INR
INR: 1.19
Prothrombin Time: 15 seconds (ref 11.4–15.2)

## 2017-08-31 LAB — COMPREHENSIVE METABOLIC PANEL
ALBUMIN: 3.2 g/dL — AB (ref 3.5–5.0)
ALT: 12 U/L — ABNORMAL LOW (ref 14–54)
AST: 15 U/L (ref 15–41)
Alkaline Phosphatase: 43 U/L (ref 38–126)
Anion gap: 7 (ref 5–15)
BILIRUBIN TOTAL: 0.3 mg/dL (ref 0.3–1.2)
CHLORIDE: 108 mmol/L (ref 101–111)
CO2: 24 mmol/L (ref 22–32)
Calcium: 8.9 mg/dL (ref 8.9–10.3)
Creatinine, Ser: 0.85 mg/dL (ref 0.44–1.00)
GFR calc Af Amer: >60 mL/min (ref >60)
GFR calc non Af Amer: >60 mL/min (ref >60)
GLUCOSE: 91 mg/dL (ref 65–99)
POTASSIUM: 3.4 mmol/L — AB (ref 3.5–5.1)
SODIUM: 139 mmol/L (ref 135–145)
Total Protein: 8.7 g/dL — ABNORMAL HIGH (ref 6.5–8.1)

## 2017-08-31 LAB — CBC
HEMATOCRIT: 23.8 % — AB (ref 35.0–47.0)
Hemoglobin: 7.4 g/dL — ABNORMAL LOW (ref 12.0–16.0)
MCH: 22.6 pg — ABNORMAL LOW (ref 26.0–34.0)
MCHC: 31.1 g/dL — ABNORMAL LOW (ref 32.0–36.0)
MCV: 72.7 fL — ABNORMAL LOW (ref 80.0–100.0)
PLATELETS: 199 10*3/uL (ref 150–440)
RBC: 3.27 MIL/uL — ABNORMAL LOW (ref 3.80–5.20)
RDW: 21.2 % — AB (ref 11.5–14.5)
WBC: 4.8 10*3/uL (ref 3.6–11.0)

## 2017-08-31 LAB — APTT: aPTT: 25 seconds (ref 24–36)

## 2017-08-31 MED ORDER — MORPHINE SULFATE (PF) 4 MG/ML IV SOLN
4.0000 mg | Freq: Once | INTRAVENOUS | Status: DC
Start: 2017-08-31 — End: 2017-08-31
  Filled 2017-08-31: qty 1

## 2017-08-31 MED ORDER — MORPHINE SULFATE (PF) 4 MG/ML IV SOLN
4.0000 mg | Freq: Once | INTRAVENOUS | Status: AC
  Administered 2017-08-31: 4 mg via INTRAVENOUS

## 2017-08-31 MED ORDER — MORPHINE SULFATE (PF) 4 MG/ML IV SOLN
INTRAVENOUS | Status: AC
  Administered 2017-08-31: 4 mg via INTRAVENOUS
  Filled 2017-08-31: qty 1
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
## 2017-08-31 NOTE — ED Provider Notes (Signed)
[facility] Emergency Department Provider Note  Time seen: 1:43 PM  I have reviewed the triage vital signs and the nursing notes.   HISTORY  Chief Complaint Fall    HPI Mariah Robbins is a 51 y.o. female With a past medical history of asthma, pulmonary embolism on xarelto, presents to the emergency department with a headache after a fall. According to the patient she fell 5 days ago after drinking alcohol. Woke up the next morning with a headache. States the headache has progressively worsened over the past 5 days along with nausea over the past 2 days. Patient states as the headache has not dissipated she came to the emergency department today for evaluation. Denies any fever, weakness or numbness. 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 Denies any vomiting although does state mild nausea. Denies any other injuries.  Past Medical History:  Diagnosis Date  . Asthma   . Pulmonary emboli (HCC)     There are no active problems to display for this patient.   Past Surgical History:  Procedure Laterality Date  . TOTAL KNEE ARTHROPLASTY      Prior to Admission medications   Medication Sig Start Date End Date Taking? Authorizing Provider  rivaroxaban (XARELTO) 20 MG TABS tablet Take 20 mg by mouth daily with supper.    [provider]    Allergies  Allergen Reactions  . Ivp Dye [Iodinated Diagnostic Agents]     No family history on file.  Social History Social History  Substance Use Topics  . Smoking status: Current Every Day Smoker    Packs/day: 1.00    Years: 20.00    Types: Cigarettes  . Smokeless tobacco: Never Used  . Alcohol use 1.0 oz/week    2 Standard drinks or equivalent per week    Review of Systems Constitutional: Negative for fever. Cardiovascular: Negative for chest pain. Respiratory: Negative for shortness of breath. Gastrointestinal: Negative for abdominal pain. Positive for  nausea. Musculoskeletal: Negative for back pain. Neurological: moderate bilateral headache denies any weakness or numbness. All other ROS negative  ____________________________________________   PHYSICAL EXAM:  VITAL SIGNS: ED Triage Vitals  Enc Vitals Group     BP 08/31/17 1139 120/71     Pulse Rate 08/31/17 1139 76     Resp 08/31/17 1139 16     Temp 08/31/17 1139 99.2 F (37.3 C)     Temp Source 08/31/17 1139 Oral     SpO2 08/31/17 1139 100 %     Weight 08/31/17 1139 200 lb (90.7 kg)     Height 08/31/17 1139  (1.575 m)     Head Circumference --      Peak Flow --      Pain Score 08/31/17 1137 10     Pain Loc --      Pain Edu? --      Excl. in GC? --     Constitutional: Alert and oriented. Well appearing and in no distress. Eyes: Normal exam ENT   Head: Normocephalic and atraumatic.   Mouth/Throat: Mucous membranes are moist. Cardiovascular: Normal rate, regular rhythm. No murmur Respiratory: Normal respiratory effort without tachypnea nor retractions. Breath sounds are clear Gastrointestinal: Soft and nontender. No distention.   Musculoskeletal: Nontender with normal range of motion in all extremities. Neurologic:  Normal speech and language. No gross focal neurologic deficits. 5/5 motor in all extremities. Equal grip strengths. No pronator drift. Patient is a mild right facial  droop which is chronic per patient. Skin:  Skin is warm, dry and intact.  Psychiatric: Mood and affect are normal.  ____________________________________________   RADIOLOGY  CT scan of the head shows bilateral subdural hematoma. CT of the spine is negative, besides dysmorphic right mandibular head, patient states her face/droop is unchanged since birth.  ____________________________________________   INITIAL IMPRESSION / ASSESSMENT AND PLAN / ED COURSE  Pertinent labs & imaging results that were available during my care of the patient were reviewed by me and considered in my  medical decision making (see chart for details).  patient presents to the emergency department with a worsening headache over the past 5 days since falling and hitting her head. He is not sure if she passed out. Denies any vomiting but does state nausea over the past 2 days. Patient is on anticoagulation for PEs chronically. Differential this time would include intracranial hemorrhage, tension headache, migraine headache, mostly skeletal pain. We will obtain a CT scan of head, neck.  CT scan is consistent with bilateral small subdural hematomas. We'll obtain labs and discussed with neurosurgery. Patient agreeable to plan.  patient's labs are resulted with anemia hemoglobin of 7.4. Patient states over the past several months she has had very heavy periods although denies any active bleeding at this time. Patient's rectal exam shows light brown stool, guaiac negative. I discussed the patient with Dr. Shirlee More at [facility] neurosurgery. They have accepted the patient to the stepdown unit, once a bed is available. We will hold Xarelto, patient will be transferred once a bed is available. I discussed this with the patient who is agreeable to this plan.  ____________________________________________   FINAL CLINICAL IMPRESSION(S) / ED DIAGNOSES  bilateral subdural hematoma    Minna Antis, MD 08/31/17 1459

## 2017-08-31 NOTE — ED Notes (Signed)
Pt en route to [facility]. Tried to call report. Unsuccessful at this time. [facility] will call back "ASAP" per bed placement, as they understand pt is en route.

## 2017-08-31 NOTE — ED Notes (Signed)
Pt discussed with Dr. Darnelle Catalan, Verbal order given for CT head and neck and x-ray of left ring finger

## 2017-08-31 NOTE — ED Notes (Signed)
Pt given 1/4 cup of ice chips. Ok with Dr. Lenard Lance

## 2017-10-23 ENCOUNTER — Emergency Department
Admission: EM | Admit: 2017-10-23 | Discharge: 2017-10-23 | Disposition: A | Discharge status: Home / Self Care | Payer: Medicaid Other | Attending: Emergency Medicine | Admitting: Emergency Medicine

## 2017-10-23 ENCOUNTER — Emergency Department: Payer: Medicaid Other

## 2017-10-23 ENCOUNTER — Encounter: Payer: Self-pay | Admitting: Emergency Medicine

## 2017-10-23 DIAGNOSIS — H10403 Unspecified chronic conjunctivitis, bilateral: Secondary | ICD-10-CM | POA: Insufficient documentation

## 2017-10-23 DIAGNOSIS — J45909 Unspecified asthma, uncomplicated: Secondary | ICD-10-CM | POA: Insufficient documentation

## 2017-10-23 DIAGNOSIS — Y939 Activity, unspecified: Secondary | ICD-10-CM | POA: Diagnosis not present

## 2017-10-23 DIAGNOSIS — Y999 Unspecified external cause status: Secondary | ICD-10-CM | POA: Insufficient documentation

## 2017-10-23 DIAGNOSIS — Y92009 Unspecified place in unspecified non-institutional (private) residence as the place of occurrence of the external cause: Secondary | ICD-10-CM | POA: Insufficient documentation

## 2017-10-23 DIAGNOSIS — Z0471 Encounter for examination and observation following alleged adult physical abuse: Secondary | ICD-10-CM | POA: Insufficient documentation

## 2017-10-23 DIAGNOSIS — Z86711 Personal history of pulmonary embolism: Secondary | ICD-10-CM | POA: Insufficient documentation

## 2017-10-23 DIAGNOSIS — H109 Unspecified conjunctivitis: Secondary | ICD-10-CM

## 2017-10-23 DIAGNOSIS — F17210 Nicotine dependence, cigarettes, uncomplicated: Secondary | ICD-10-CM | POA: Diagnosis not present

## 2017-10-23 DIAGNOSIS — S0993XA Unspecified injury of face, initial encounter: Secondary | ICD-10-CM | POA: Diagnosis present

## 2017-10-23 DIAGNOSIS — S0240DA Maxillary fracture, left side, initial encounter for closed fracture: Secondary | ICD-10-CM | POA: Insufficient documentation

## 2017-10-23 DIAGNOSIS — S02401A Maxillary fracture, unspecified, initial encounter for closed fracture: Secondary | ICD-10-CM

## 2017-10-23 MED ORDER — HYDROCODONE-ACETAMINOPHEN 5-325 MG PO TABS: 1.0000 | ORAL_TABLET | Freq: Four times a day (QID) | ORAL | 0 refills | Status: AC | PRN

## 2017-10-23 MED ORDER — AMOXICILLIN-POT CLAVULANATE 875-125 MG PO TABS: 1.0000 | ORAL_TABLET | Freq: Two times a day (BID) | ORAL | 0 refills | Status: AC

## 2017-10-23 MED ORDER — HYDROCODONE-ACETAMINOPHEN 5-325 MG PO TABS
2.0000 | ORAL_TABLET | Freq: Once | ORAL | Status: AC
  Administered 2017-10-23: 2 via ORAL
  Filled 2017-10-23: qty 2

## 2017-10-23 MED ORDER — FLUORESCEIN SODIUM 1 MG OP STRP: 1.0000 | ORAL_STRIP | Freq: Once | OPHTHALMIC | Status: DC

## 2017-10-23 MED ORDER — FLUORESCEIN SODIUM 1 MG OP STRP
ORAL_STRIP | OPHTHALMIC | Status: AC
  Filled 2017-10-23: qty 1

## 2017-10-23 MED ORDER — TETRACAINE HCL 0.5 % OP SOLN: 1.0000 [drp] | Freq: Once | OPHTHALMIC | Status: DC

## 2017-10-23 MED ORDER — TETRACAINE HCL 0.5 % OP SOLN
OPHTHALMIC | Status: AC
  Filled 2017-10-23: qty 4

## 2017-10-23 MED ORDER — TOBRAMYCIN 0.3 % OP SOLN: 2.0000 [drp] | OPHTHALMIC | 0 refills | Status: AC
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
